# Patient Record
Sex: Female | Born: 2000 | Race: White | Hispanic: No | Marital: Single | Smoking: Never smoker
Health system: Southern US, Community
[De-identification: ages and names within clinical notes are randomized; demographics above are authoritative.]

## PROBLEM LIST (undated history)

## (undated) DIAGNOSIS — F419 Anxiety disorder, unspecified: Secondary | ICD-10-CM

## (undated) DIAGNOSIS — M51369 Other intervertebral disc degeneration, lumbar region without mention of lumbar back pain or lower extremity pain: Secondary | ICD-10-CM

## (undated) DIAGNOSIS — F32A Depression, unspecified: Secondary | ICD-10-CM

## (undated) HISTORY — PX: WISDOM TOOTH EXTRACTION: SHX21

## (undated) HISTORY — DX: Anxiety disorder, unspecified: F41.9

## (undated) HISTORY — PX: BACK SURGERY: SHX140

## (undated) HISTORY — PX: HAND SURGERY: SHX662

---
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
## 2022-06-10 ENCOUNTER — Encounter (HOSPITAL_COMMUNITY): Payer: Self-pay

## 2022-06-10 ENCOUNTER — Ambulatory Visit (HOSPITAL_COMMUNITY)
Admission: EM | Admit: 2022-06-10 | Discharge: 2022-06-10 | Disposition: A | Discharge status: Home / Self Care | Payer: 59 | Attending: Emergency Medicine | Admitting: Emergency Medicine

## 2022-06-10 ENCOUNTER — Ambulatory Visit (INDEPENDENT_AMBULATORY_CARE_PROVIDER_SITE_OTHER)
Admission: EM | Admit: 2022-06-10 | Discharge: 2022-06-10 | Disposition: A | Discharge status: Home / Self Care | Payer: 59 | Source: Home / Self Care

## 2022-06-10 DIAGNOSIS — Z202 Contact with and (suspected) exposure to infections with a predominantly sexual mode of transmission: Secondary | ICD-10-CM

## 2022-06-10 DIAGNOSIS — Z113 Encounter for screening for infections with a predominantly sexual mode of transmission: Secondary | ICD-10-CM | POA: Insufficient documentation

## 2022-06-10 HISTORY — DX: Depression, unspecified: F32.A

## 2022-06-10 LAB — HIV ANTIBODY (ROUTINE TESTING W REFLEX): HIV Screen 4th Generation wRfx: NONREACTIVE

## 2022-06-10 MED ORDER — DOXYCYCLINE HYCLATE 100 MG PO CAPS: 100.0000 mg | ORAL_CAPSULE | Freq: Two times a day (BID) | ORAL | 0 refills | Status: DC

## 2022-06-10 NOTE — Discharge Instructions (Addendum)
We will call you if any of your test results are positive, you can view these test results on MyChart.   Please refrain from any sexual activity at this time.

## 2022-06-10 NOTE — ED Provider Notes (Addendum)
[facility]    CSN: [id number] Arrival date & time: 06/10/22  1312      History   Chief Complaint Chief Complaint  Patient presents with   Exposure to STD    HPI Shannon Ball is a 21 y.o. female.  Patient presents due to STD exposure.  She reports being exposed to chlamydia.  Patient reports 1 female sexual partner.  She states that during the last encounter the condom broke approximately 2 weeks ago.  Patient denies any vaginal discharge, pelvic pain, or abdominal pain.  She reports having an IUD. `   Exposure to STD Pertinent negatives include no abdominal pain.    Past Medical History:  Diagnosis Date   Anxiety and depression     There are no problems to display for this patient.   Past Surgical History:  Procedure Laterality Date   HAND SURGERY Right     OB History   No obstetric history on file.      Home Medications    Prior to Admission medications   Medication Sig Start Date End Date Taking? Authorizing Provider  doxycycline (VIBRAMYCIN) 100 MG capsule Take 1 capsule (100 mg total) by mouth 2 (two) times daily. 06/10/22  Yes Debby Freiberg, NP  famotidine (PEPCID AC) 10 MG tablet Take 1 tablet by mouth daily. 02/16/12  Yes [provider]  loratadine (CLARITIN) 10 MG tablet  02/16/12  Yes [provider]  escitalopram (LEXAPRO) 20 MG tablet Take 20 mg by mouth daily.    [provider]    Family History History reviewed. No pertinent family history.  Social History Social History   Tobacco Use   Smoking status: Never   Smokeless tobacco: Never  Substance Use Topics   Alcohol use: Never   Drug use: Never     Allergies   Fluoxetine, Lactose, and Soybean oil   Review of Systems Review of Systems  Constitutional:  Negative for activity change, chills and fever.  Gastrointestinal:  Negative for abdominal pain, nausea and vomiting.  Genitourinary: Negative.  Negative for decreased urine volume,  difficulty urinating, dyspareunia, dysuria, enuresis, flank pain, frequency, genital sores, hematuria, menstrual problem, pelvic pain, urgency, vaginal bleeding, vaginal discharge and vaginal pain.     Physical Exam Triage Vital Signs ED Triage Vitals  Enc Vitals Group     BP 06/10/22 1355 103/74     Pulse Rate 06/10/22 1355 69     Resp 06/10/22 1355 16     Temp 06/10/22 1355 98.2 F (36.8 C)     Temp Source 06/10/22 1355 Oral     SpO2 06/10/22 1355 100 %     Weight --      Height --      Head Circumference --      Peak Flow --      Pain Score 06/10/22 1356 0     Pain Loc --      Pain Edu? --      Excl. in GC? --    No data found.  Updated Vital Signs BP 103/74 (BP Location: Right Arm)   Pulse 69   Temp 98.2 F (36.8 C) (Oral)   Resp 16   LMP 06/10/2022 (Exact Date)   SpO2 100%      Physical Exam Vitals and nursing note reviewed.  Constitutional:      Appearance: Normal appearance.  Genitourinary:    Comments: Deferred exam Neurological:     Mental Status: She is alert.  UC Treatments / Results  Labs (all labs ordered are listed, but only abnormal results are displayed) Labs Reviewed  CERVICOVAGINAL ANCILLARY ONLY    EKG   Radiology No results found.  Procedures Procedures (including critical care time)  Medications Ordered in UC Medications - No data to display  Initial Impression / Assessment and Plan / UC Course  I have reviewed the triage vital signs and the nursing notes.  Pertinent labs & imaging results that were available during my care of the patient were reviewed by me and considered in my medical decision making (see chart for details).     Patient was evaluated for an STD exposure.  Vaginal swab is pending.  Patient was prescribed doxycycline due to chlamydia exposure.  She was made aware of treatment regiment and possible side effects.  Patient was made aware to refrain from any sexual activity at this time.  Patient was made  aware of results reporting protocol and MyChart.  Patient was made aware of possible discontinuing doxycycline if chlamydia comes back negative.  Patient was offered blood work for syphilis and HIV, patient declined blood work.  Patient verbalized understanding of instructions.  Charting was provided using a a verbal dictation system, charting was proofread for errors, errors may occur which could change the meaning of the information charted.   Final Clinical Impressions(s) / UC Diagnoses   Final diagnoses:  STD exposure     Discharge Instructions      We will call you if any of your test results warrant a change in your plan of care.  You may view these test results on MyChart.   Doxycycline is being sent to the pharmacy, you will take this medication 2 times daily for the next 7 days.  Please make sure to take this medication with a full glass of water.    Please refrain from any sexual activity at this time.      ED Prescriptions     Medication Sig Dispense Auth. Provider   doxycycline (VIBRAMYCIN) 100 MG capsule Take 1 capsule (100 mg total) by mouth 2 (two) times daily. 14 capsule Debby Freiberg, NP      PDMP not reviewed this encounter.   Debby Freiberg, NP 06/10/22 1700    Debby Freiberg, NP 06/10/22 1701

## 2022-06-10 NOTE — Discharge Instructions (Addendum)
We will call you if any of your test results warrant a change in your plan of care.  You may view these test results on MyChart.   Doxycycline is being sent to the pharmacy, you will take this medication 2 times daily for the next 7 days.  Please make sure to take this medication with a full glass of water.    Please refrain from any sexual activity at this time.

## 2022-06-10 NOTE — ED Provider Notes (Signed)
[facility]    CSN: [id number] Arrival date & time: 06/10/22  1630      History   Chief Complaint No chief complaint on file.   HPI Shannon Ball is a 21 y.o. female.  Patient was seen earlier today for an STD exposure.  Patient was given doxycycline due to exposure to chlamydia and cervicovaginal swab was performed.  Patient decided to come back to clinic for blood work for syphilis and HIV.  Patient was offered blood work during the first visit but declined at the time. Patient has no other complaints.   HPI  Past Medical History:  Diagnosis Date   Anxiety and depression     There are no problems to display for this patient.   Past Surgical History:  Procedure Laterality Date   HAND SURGERY Right     OB History   No obstetric history on file.      Home Medications    Prior to Admission medications   Medication Sig Start Date End Date Taking? Authorizing Provider  doxycycline (VIBRAMYCIN) 100 MG capsule Take 1 capsule (100 mg total) by mouth 2 (two) times daily. 06/10/22   Debby Freiberg, NP  escitalopram (LEXAPRO) 20 MG tablet Take 20 mg by mouth daily.    [provider]  famotidine (PEPCID AC) 10 MG tablet Take 1 tablet by mouth daily. 02/16/12   [provider]  loratadine (CLARITIN) 10 MG tablet  02/16/12   [provider]    Family History No family history on file.  Social History Social History   Tobacco Use   Smoking status: Never   Smokeless tobacco: Never  Substance Use Topics   Alcohol use: Never   Drug use: Never     Allergies   Fluoxetine, Lactose, and Soybean oil   Review of Systems Review of Systems  Constitutional: Negative.   Gastrointestinal: Negative.   Genitourinary: Negative.      Physical Exam Triage Vital Signs ED Triage Vitals  Enc Vitals Group     BP      Pulse      Resp      Temp      Temp src      SpO2      Weight      Height      Head Circumference       Peak Flow      Pain Score      Pain Loc      Pain Edu?      Excl. in GC?    No data found.  Updated Vital Signs LMP 06/10/2022 (Exact Date)      Physical Exam Vitals and nursing note reviewed.  Constitutional:      Appearance: Normal appearance.  Neurological:     Mental Status: She is alert.      UC Treatments / Results  Labs (all labs ordered are listed, but only abnormal results are displayed) Labs Reviewed  RPR  HIV ANTIBODY (ROUTINE TESTING W REFLEX)    EKG   Radiology No results found.  Procedures Procedures (including critical care time)  Medications Ordered in UC Medications - No data to display  Initial Impression / Assessment and Plan / UC Course  I have reviewed the triage vital signs and the nursing notes.  Pertinent labs & imaging results that were available during my care of the patient were reviewed by me and considered in my medical decision making (see chart for details).  Patient was evaluated for STD screening .  Patient presented back to clinic due to changing her mind on wanting blood work. RPR and HIV testing is pending.  Patient was made aware of results reporting protocol and MyChart.  Patient verbalized understanding of instructions.  Charting was provided using a a verbal dictation system, charting was proofread for errors, errors may occur which could change the meaning of the information charted.   Final Clinical Impressions(s) / UC Diagnoses   Final diagnoses:  Screening for STD (sexually transmitted disease)  STD exposure     Discharge Instructions      We will call you if any of your test results are positive, you can view these test results on MyChart.   Please refrain from any sexual activity at this time.      ED Prescriptions   None    PDMP not reviewed this encounter.   Debby Freiberg, NP 06/10/22 1726

## 2022-06-10 NOTE — ED Triage Notes (Signed)
Pt states her partner tested positive for chlamydia so she would like to be tested.

## 2022-06-11 LAB — CERVICOVAGINAL ANCILLARY ONLY
Bacterial Vaginitis (gardnerella): NEGATIVE
Candida Glabrata: NEGATIVE
Candida Vaginitis: POSITIVE — AB
Chlamydia: POSITIVE — AB
Comment: NEGATIVE
Comment: NEGATIVE
Comment: NEGATIVE
Comment: NEGATIVE
Comment: NEGATIVE
Comment: NORMAL
Neisseria Gonorrhea: NEGATIVE
Trichomonas: NEGATIVE

## 2022-06-11 LAB — RPR: RPR Ser Ql: NONREACTIVE

## 2022-06-12 ENCOUNTER — Telehealth (HOSPITAL_COMMUNITY): Payer: Self-pay | Admitting: Emergency Medicine

## 2022-06-12 MED ORDER — FLUCONAZOLE 150 MG PO TABS: 150.0000 mg | ORAL_TABLET | Freq: Once | ORAL | 0 refills | Status: AC

## 2023-10-27 IMAGING — CR DG WRIST COMPLETE 3+V*L*
4 series · 4 of 4 positions shown · non-contrast
Comparison: None.

CLINICAL DATA: Left wrist pain.

EXAM:
LEFT WRIST - COMPLETE 3+ VIEW

[x wrist pa left]
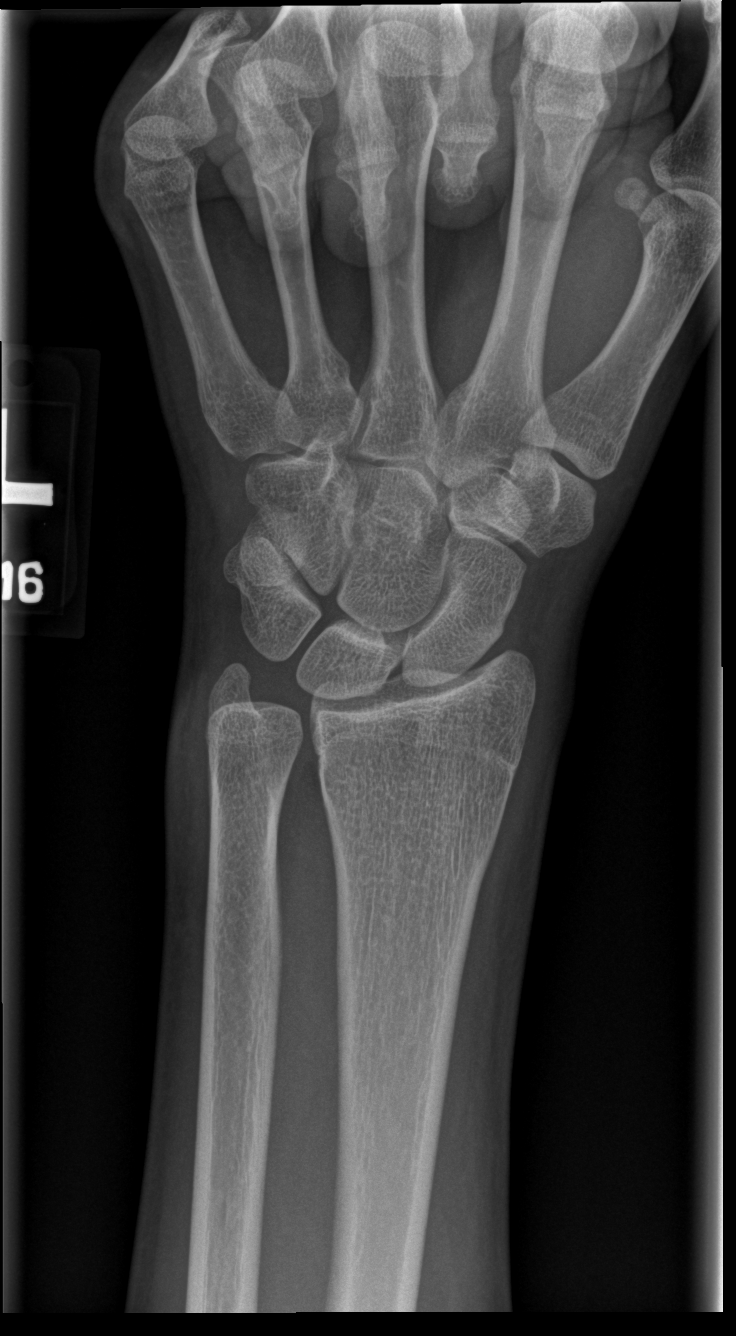

[x wrist obl left]
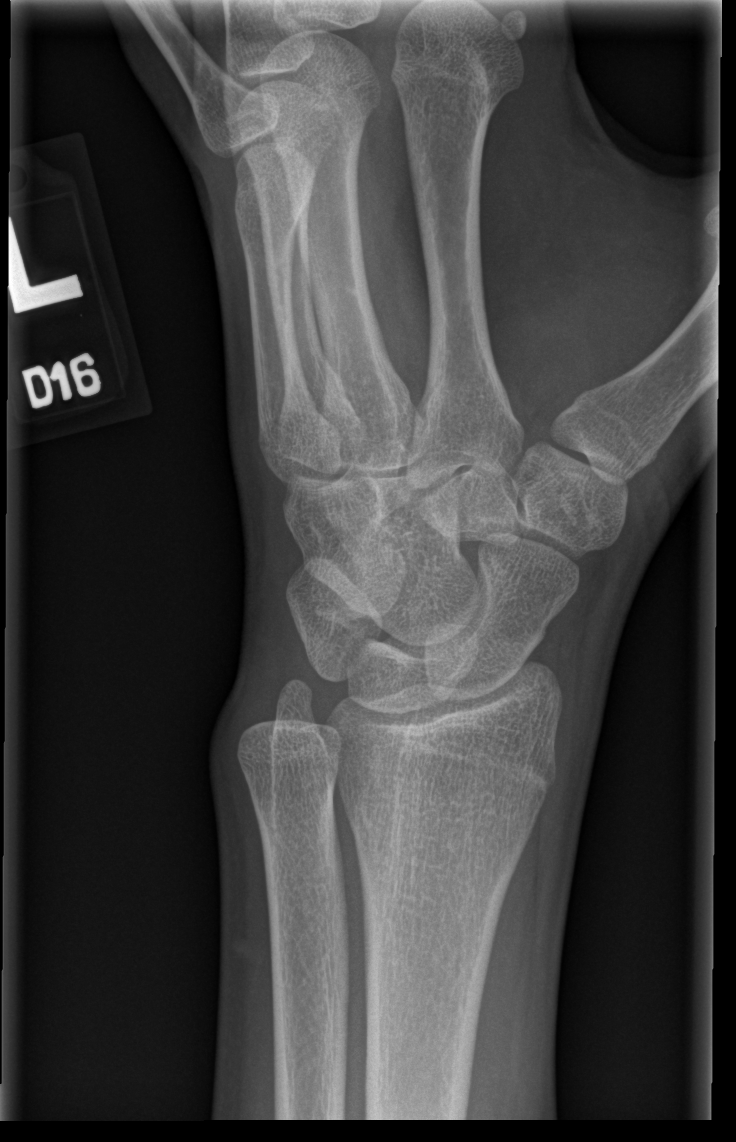

[x wrist lat left]
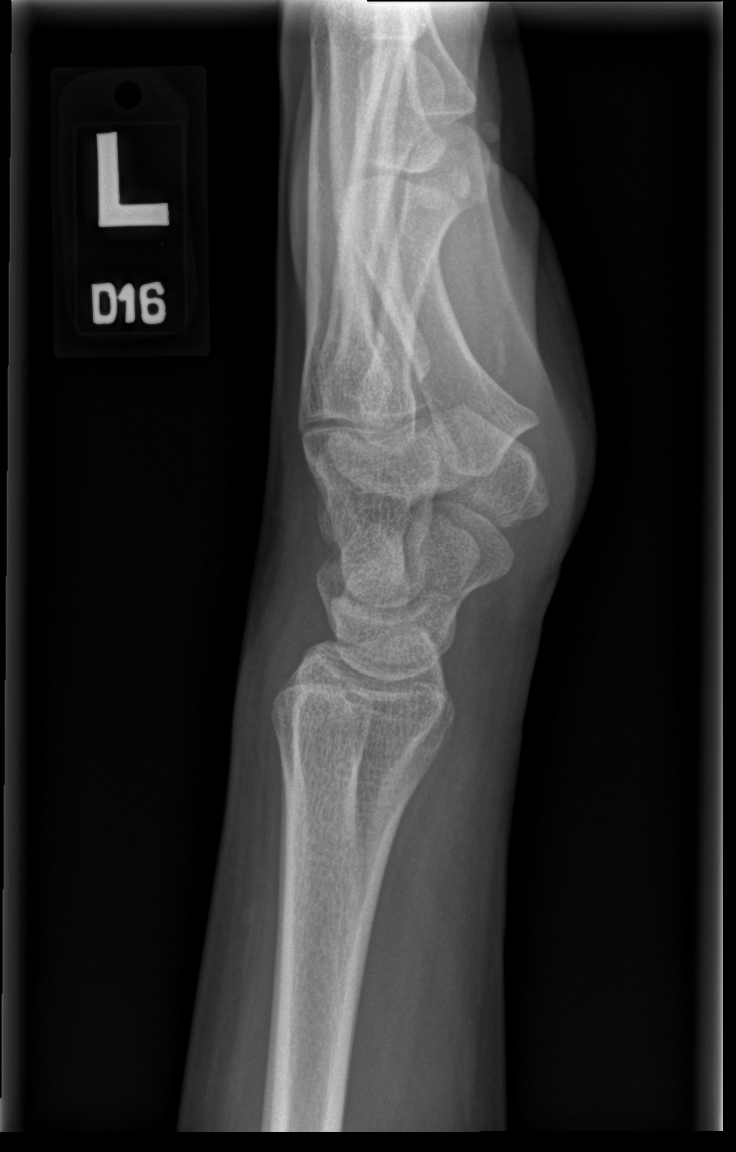

[x wrist navicular view left]
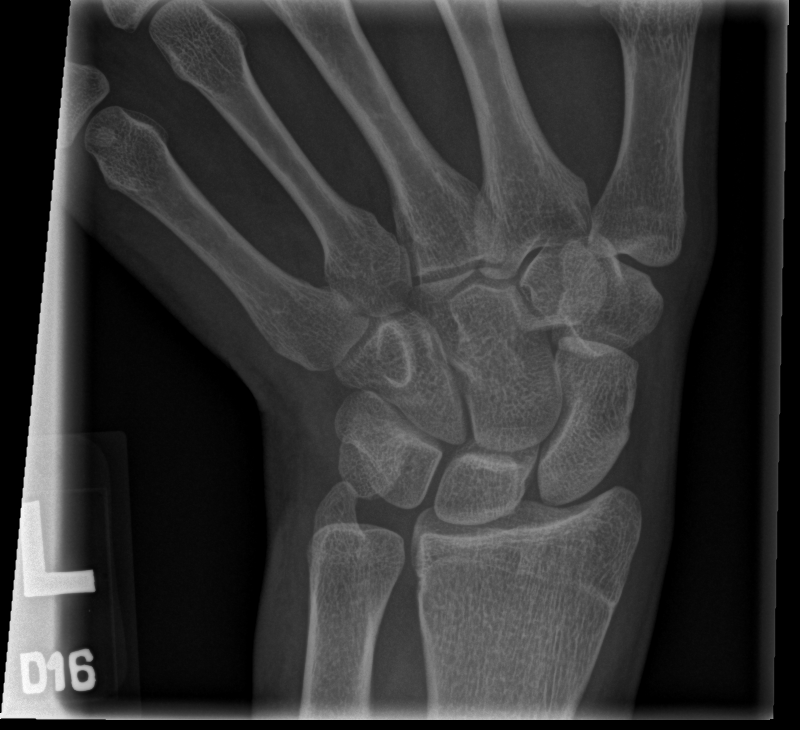

[4 of 4 positions shown; findings below may reference images not displayed]

FINDINGS: There is no evidence of fracture or dislocation. Normal alignment
and joint spaces. The growth plates have near completely fused,
distal radial growth plate is fusing. There is no evidence of
arthropathy or other focal bone abnormality. Soft tissues are
unremarkable.
IMPRESSION: Negative radiographs of the left wrist.

## 2024-08-05 ENCOUNTER — Encounter (HOSPITAL_BASED_OUTPATIENT_CLINIC_OR_DEPARTMENT_OTHER): Payer: Self-pay

## 2024-08-05 ENCOUNTER — Ambulatory Visit (HOSPITAL_BASED_OUTPATIENT_CLINIC_OR_DEPARTMENT_OTHER)
Admission: EM | Admit: 2024-08-05 | Discharge: 2024-08-05 | Disposition: A | Discharge status: Home / Self Care | Payer: PRIVATE HEALTH INSURANCE

## 2024-08-05 DIAGNOSIS — L568 Other specified acute skin changes due to ultraviolet radiation: Secondary | ICD-10-CM | POA: Diagnosis not present

## 2024-08-05 DIAGNOSIS — R519 Headache, unspecified: Secondary | ICD-10-CM

## 2024-08-05 DIAGNOSIS — M542 Cervicalgia: Secondary | ICD-10-CM | POA: Diagnosis not present

## 2024-08-05 DIAGNOSIS — R110 Nausea: Secondary | ICD-10-CM

## 2024-08-05 HISTORY — DX: Other intervertebral disc degeneration, lumbar region without mention of lumbar back pain or lower extremity pain: M51.369

## 2024-08-05 MED ORDER — KETOROLAC TROMETHAMINE 30 MG/ML IJ SOLN: 30.0000 mg | Freq: Once | INTRAMUSCULAR | Status: DC

## 2024-08-05 MED ORDER — KETOROLAC TROMETHAMINE 10 MG PO TABS: 10.0000 mg | ORAL_TABLET | Freq: Four times a day (QID) | ORAL | 0 refills | Status: DC | PRN

## 2024-08-05 MED ORDER — DICLOFENAC SODIUM 50 MG PO TBEC: 50.0000 mg | DELAYED_RELEASE_TABLET | Freq: Two times a day (BID) | ORAL | 0 refills | Status: AC | PRN

## 2024-08-05 MED ORDER — CYCLOBENZAPRINE HCL 5 MG PO TABS: 5.0000 mg | ORAL_TABLET | Freq: Every evening | ORAL | 0 refills | Status: AC | PRN

## 2024-08-05 MED ORDER — ONDANSETRON 4 MG PO TBDP: 4.0000 mg | ORAL_TABLET | Freq: Three times a day (TID) | ORAL | 0 refills | Status: AC | PRN

## 2024-08-05 NOTE — ED Triage Notes (Addendum)
 Pt c/o migraines-sharp/aching, runny nose, neck pain-sharp/aching feels different than a stress HA, nausea, and light sensitivity. She has taken Advil and tylenol with slight relief. She thought the nausea was due to her buspirone so she stopped taking it and it helped some but not fully resolved.

## 2024-08-05 NOTE — ED Provider Notes (Signed)
 " [facility]    CSN: [id number] Arrival date & time: 08/05/24  1655      History   Chief Complaint Chief Complaint  Patient presents with   Migraine   Neck Pain    HPI Shannon Ball is a 24 y.o. female.   24 year old female with report of severe headache this been going on for approximately a week (since 07/30/2024 or earlier).  She does not have a previous diagnosis of migraines.  She has got photosensitivity, throbbing headache on her forehead her occipital area her face and her neck and upper shoulders.  She has had tension headaches or stress headaches in the past but this does not feel like that.  She has the headache, nausea but no vomiting and photosensitivity.  She has taken Advil and acetaminophen with relief of symptoms.  She thought possibly the nausea was secondary to buspirone use.  She stopped the buspirone but the nausea did not fully resolve.  The nausea did improve some after stopping the buspirone.  She denies dizziness, fever, cough, congestion, vomiting, diarrhea, constipation.   Migraine Associated symptoms include headaches. Pertinent negatives include no chest pain, no abdominal pain and no shortness of breath.  Neck Pain Associated symptoms: headaches   Associated symptoms: no chest pain and no fever     Past Medical History:  Diagnosis Date   Anxiety and depression    DDD (degenerative disc disease), lumbar     There are no active problems to display for this patient.   Past Surgical History:  Procedure Laterality Date   BACK SURGERY     HAND SURGERY Right    x2   WISDOM TOOTH EXTRACTION      OB History   No obstetric history on file.      Home Medications    Prior to Admission medications  Medication Sig Start Date End Date Taking? Authorizing Provider  cyclobenzaprine  (FLEXERIL ) 5 MG tablet Take 1 tablet (5 mg total) by mouth at bedtime as needed for up to 15 days for muscle spasms. 08/05/24 08/20/24 Yes Ival Domino, FNP   diclofenac  (VOLTAREN ) 50 MG EC tablet Take 1 tablet (50 mg total) by mouth 2 (two) times daily as needed for up to 15 days (take with food). 08/05/24 08/20/24 Yes Ival Domino, FNP  ondansetron  (ZOFRAN -ODT) 4 MG disintegrating tablet Take 1 tablet (4 mg total) by mouth every 8 (eight) hours as needed for nausea or vomiting. 08/05/24  Yes Ival Domino, FNP  escitalopram (LEXAPRO) 20 MG tablet Take 20 mg by mouth daily.    [provider]  famotidine (PEPCID AC) 10 MG tablet Take 1 tablet by mouth daily. 02/16/12   [provider]  levonorgestrel (MIRENA) 20 MCG/DAY IUD 52,000 mcg by Intrauterine route.    [provider]  loratadine (CLARITIN) 10 MG tablet  02/16/12   [provider]    Family History History reviewed. No pertinent family history.  Social History Social History[1]   Allergies   Fluoxetine, Lactose, and Soybean oil   Review of Systems Review of Systems  Constitutional:  Negative for chills and fever.  HENT:  Negative for ear pain and sore throat.   Eyes:  Negative for pain and visual disturbance.  Respiratory:  Negative for cough and shortness of breath.   Cardiovascular:  Negative for chest pain and palpitations.  Gastrointestinal:  Positive for nausea. Negative for abdominal pain, constipation, diarrhea and vomiting.  Genitourinary:  Negative for dysuria and hematuria.  Musculoskeletal:  Positive for neck pain. Negative for arthralgias and back pain.  Skin:  Negative for color change and rash.  Neurological:  Positive for headaches. Negative for seizures and syncope.  All other systems reviewed and are negative.    Physical Exam Triage Vital Signs ED Triage Vitals  Encounter Vitals Group     BP 08/05/24 1733 109/66     Girls Systolic BP Percentile --      Girls Diastolic BP Percentile --      Boys Systolic BP Percentile --      Boys Diastolic BP Percentile --      Pulse Rate 08/05/24 1733 (!) 56     Resp 08/05/24 1733 20      Temp 08/05/24 1733 97.8 F (36.6 C)     Temp Source 08/05/24 1733 Oral     SpO2 08/05/24 1733 98 %     Weight --      Height --      Head Circumference --      Peak Flow --      Pain Score 08/05/24 1729 6     Pain Loc --      Pain Education --      Exclude from Growth Chart --    No data found.  Updated Vital Signs BP 109/66 (BP Location: Right Arm)   Pulse (!) 56   Temp 97.8 F (36.6 C) (Oral)   Resp 20   SpO2 98%   Visual Acuity Right Eye Distance:   Left Eye Distance:   Bilateral Distance:    Right Eye Near:   Left Eye Near:    Bilateral Near:     Physical Exam Vitals and nursing note reviewed.  Constitutional:      General: She is not in acute distress.    Appearance: She is well-developed. She is not ill-appearing or toxic-appearing.  HENT:     Head: Normocephalic and atraumatic.     Right Ear: Hearing, tympanic membrane, ear canal and external ear normal.     Left Ear: Hearing, tympanic membrane, ear canal and external ear normal.     Nose: No congestion or rhinorrhea.     Right Sinus: No maxillary sinus tenderness or frontal sinus tenderness.     Left Sinus: No maxillary sinus tenderness or frontal sinus tenderness.     Mouth/Throat:     Lips: Pink.     Mouth: Mucous membranes are moist.     Pharynx: Uvula midline. No oropharyngeal exudate or posterior oropharyngeal erythema.     Tonsils: No tonsillar exudate.  Eyes:     Conjunctiva/sclera: Conjunctivae normal.     Pupils: Pupils are equal, round, and reactive to light.  Cardiovascular:     Rate and Rhythm: Normal rate and regular rhythm.     Heart sounds: S1 normal and S2 normal. No murmur heard. Pulmonary:     Effort: Pulmonary effort is normal. No respiratory distress.     Breath sounds: Normal breath sounds. No decreased breath sounds, wheezing, rhonchi or rales.  Abdominal:     General: Bowel sounds are normal.     Palpations: Abdomen is soft.     Tenderness: There is no abdominal  tenderness.  Musculoskeletal:        General: No swelling.     Cervical back: Neck supple. No swelling, edema, deformity, erythema, signs of trauma, lacerations, rigidity, spasms, torticollis, tenderness, bony tenderness or crepitus. Pain with movement and muscular tenderness present. No spinous process tenderness. Normal range of  motion.     Thoracic back: Normal.  Lymphadenopathy:     Head:     Right side of head: No submental, submandibular, tonsillar, preauricular or posterior auricular adenopathy.     Left side of head: No submental, submandibular, tonsillar, preauricular or posterior auricular adenopathy.     Cervical: No cervical adenopathy.     Right cervical: No superficial cervical adenopathy.    Left cervical: No superficial cervical adenopathy.  Skin:    General: Skin is warm and dry.     Capillary Refill: Capillary refill takes less than 2 seconds.     Findings: No rash.  Neurological:     Mental Status: She is alert and oriented to person, place, and time.     Cranial Nerves: Cranial nerves 2-12 are intact.     Sensory: Sensation is intact.     Motor: Motor function is intact.     Coordination: Coordination is intact.     Gait: Gait is intact.  Psychiatric:        Mood and Affect: Mood normal.      UC Treatments / Results  Labs (all labs ordered are listed, but only abnormal results are displayed) Basic Metabolic Panel: 02/03/24: Order: [id number] Component Ref Range & Units 6 mo ago  Sodium 135 - 146 mmol/L 138  Potassium 3.4 - 5.2 mmol/L 3.8  Comment: Plasma sample  Chloride 98 - 110 mmol/L 108  Total CO2/Bicarbonate 24 - 32 mmol/L 21 Low   Anion Gap 2 - 15 mmol/L 9  BUN 7 - 24 mg/dL 12  Creatinine, Blood 9.49 - 1.10 mg/dL 9.14  Glucose, Blood 70 - 118 mg/dL 91  Calcium 8.5 - 89.4 mg/dL 9.3  Estimated GFR(CKD-EPI) >=60 mL/min/BSA 98  Comment: This Cr-based equation underestimates GFR in patients with increased muscle mass. Order CYSTATIN C WITH  GFR ESTIMATE, [id number], if additional evaluation of renal function is needed.  Resulting Agency [facility]    EKG   Radiology No results found.  Procedures Procedures (including critical care time)  Medications Ordered in UC Medications - No data to display   Initial Impression / Assessment and Plan / UC Course  I have reviewed the triage vital signs and the nursing notes.  Pertinent labs & imaging results that were available during my care of the patient were reviewed by me and considered in my medical decision making (see chart for details).  Plan of Care (see discharge instructions for additional patient precautions and education): Headache with nausea but without vomiting and with photosensitivity: Exam was essentially normal.  Neurologically she is normal.  She does have tenderness in the upper neck and shoulders.  She is photosensitive.  Offered ketorolac  30 mg injection and she originally agreed but after some thought she decided that she was too anxious about needles and shots and she declined ketorolac .  Ketorolac  oral was canceled since she did not have the injection.  Diclofenac  50 mg every 12 hours with food if needed for headache.  Ondansetron , 4 mg, every 8 hours if needed for nausea and vomiting.  Cyclobenzaprine , 5 mg, every evening for muscle spasms and neck pain.  Do not use cyclobenzaprine  and drive.  If symptoms do not resolve, if symptoms worsen or if new symptoms occur, go to an emergency room for further workup.  May need to see primary care and get a referral to neurology if headaches persist.  I reviewed the plan of care with the patient and/or the patient's guardian.  The patient  and/or guardian had time to ask questions and acknowledged that the questions were answered.  Final Clinical Impressions(s) / UC Diagnoses   Final diagnoses:  Nonintractable headache, unspecified chronicity pattern, unspecified headache type  Nausea without vomiting   Photosensitivity  Neck pain     Discharge Instructions      Headache with nausea but without vomiting and with photosensitivity: Exam was essentially normal.  Neurologically she is normal.  She does have tenderness in the upper neck and shoulders.  She is photosensitive.  Offered ketorolac  30 mg injection and she originally agreed but after some thought she decided that she was too anxious about needles and shots and she declined ketorolac .  Ketorolac  oral was canceled since she did not have the injection.  Diclofenac  50 mg every 12 hours with food if needed for headache.  Ondansetron , 4 mg, every 8 hours if needed for nausea and vomiting.  Cyclobenzaprine , 5 mg, every evening for muscle spasms and neck pain.  Do not use cyclobenzaprine  and drive.  If symptoms do not resolve, if symptoms worsen or if new symptoms occur, go to an emergency room for further workup.  May need to see primary care and get a referral to neurology if headaches persist.     ED Prescriptions     Medication Sig Dispense Auth. Provider   ketorolac  (TORADOL ) 10 MG tablet  (Status: Discontinued) Take 1 tablet (10 mg total) by mouth every 6 (six) hours as needed. 20 tablet Karri Kallenbach, FNP   ondansetron  (ZOFRAN -ODT) 4 MG disintegrating tablet Take 1 tablet (4 mg total) by mouth every 8 (eight) hours as needed for nausea or vomiting. 20 tablet Vayla Wilhelmi, FNP   diclofenac  (VOLTAREN ) 50 MG EC tablet Take 1 tablet (50 mg total) by mouth 2 (two) times daily as needed for up to 15 days (take with food). 30 tablet Stewart Sasaki, FNP   cyclobenzaprine  (FLEXERIL ) 5 MG tablet Take 1 tablet (5 mg total) by mouth at bedtime as needed for up to 15 days for muscle spasms. 15 tablet Janna Oak, FNP      PDMP not reviewed this encounter.    [1]  Social History Tobacco Use   Smoking status: Never   Smokeless tobacco: Never  Vaping Use   Vaping status: Never Used  Substance Use Topics   Alcohol use: Never   Drug use:  Never     Ival Domino, FNP 08/05/24 1842  "

## 2024-08-05 NOTE — Discharge Instructions (Addendum)
 Headache with nausea but without vomiting and with photosensitivity: Exam was essentially normal.  Neurologically she is normal.  She does have tenderness in the upper neck and shoulders.  She is photosensitive.  Offered ketorolac  30 mg injection and she originally agreed but after some thought she decided that she was too anxious about needles and shots and she declined ketorolac .  Ketorolac  oral was canceled since she did not have the injection.  Diclofenac  50 mg every 12 hours with food if needed for headache.  Ondansetron , 4 mg, every 8 hours if needed for nausea and vomiting.  Cyclobenzaprine , 5 mg, every evening for muscle spasms and neck pain.  Do not use cyclobenzaprine  and drive.  If symptoms do not resolve, if symptoms worsen or if new symptoms occur, go to an emergency room for further workup.  May need to see primary care and get a referral to neurology if headaches persist.
